# Patient Record
Sex: Male | Born: 1995 | Race: White | Hispanic: No | Marital: Single
Health system: Midwestern US, Academic
[De-identification: ages and names within clinical notes are randomized; demographics above are authoritative.]

---

## 2021-12-24 ENCOUNTER — Emergency Department: Admit: 2021-12-24 | Discharge: 2021-12-24

## 2021-12-24 ENCOUNTER — Encounter: Admit: 2021-12-24 | Discharge: 2021-12-24

## 2021-12-24 LAB — BASIC METABOLIC PANEL
ANION GAP: 9 % (ref 3–12)
CHLORIDE: 105 MMOL/L (ref 98–110)
SODIUM: 141 MMOL/L (ref 137–147)

## 2021-12-24 LAB — ALCOHOL LEVEL: ALCOHOL: <10 mg/dL (ref >60)

## 2021-12-24 LAB — CREATINE KINASE-CPK: CK TOTAL: 92 U/L (ref 35–232)

## 2021-12-24 LAB — IONIZED CALCIUM: IONIZED CALCIUM: 1.1 MMOL/L (ref 1.0–1.3)

## 2021-12-24 LAB — CBC
HEMOGLOBIN: 14 g/dL (ref 13.5–16.5)
MCHC: 34 g/dL (ref 32.0–36.0)
MCV: 88 FL — ABNORMAL LOW (ref 80–100)
PLATELET COUNT: 264 K/UL — ABNORMAL HIGH (ref 150–400)
RBC COUNT: 4.7 M/UL (ref 4.4–5.5)
WBC COUNT: 14 K/UL — ABNORMAL HIGH (ref 4.5–11.0)

## 2021-12-24 LAB — LACTIC ACID (BG - RAPID LACTATE): LACTIC ACID(SYRINGE): 1.4 MMOL/L (ref 0.5–2.0)

## 2021-12-24 LAB — BETA-HCG: BETA-HCG: <1 U/L (ref <5)

## 2021-12-24 MED ORDER — IOHEXOL 350 MG IODINE/ML IV SOLN
100 mL | Freq: Once | INTRAVENOUS | 0 refills | Status: CP
Start: 2021-12-24 — End: ?
  Administered 2021-12-24: 100 mL via INTRAVENOUS

## 2021-12-24 MED ORDER — FENTANYL CITRATE (PF) 50 MCG/ML IJ SOLN
50 ug | Freq: Once | INTRAVENOUS | 0 refills | Status: CP
Start: 2021-12-24 — End: ?
  Administered 2021-12-25: 01:00:00 50 ug via INTRAVENOUS

## 2021-12-24 MED ORDER — SODIUM CHLORIDE 0.9 % IJ SOLN
50 mL | Freq: Once | INTRAVENOUS | 0 refills | Status: CP
Start: 2021-12-24 — End: ?
  Administered 2021-12-24: 50 mL via INTRAVENOUS
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
## 2021-12-24 NOTE — Other
12/24/2021 Micheal Steele [phone]    Attending of Record: Fransico Setters, MD   Performing Provider: Veva Holes, MD    Procedure: Limited Abdominal Ultrasound (Trauma FAST Exam)    Areas viewed: right lateral window, left lateral window, sub-xyphoid/pericardial window, and pelvic window    Indication for Procedure: trauma    Findings: Negative for intra-abdominal free fluid or pericardial effusion    Plan from Findings: CT Head, C-spine, Chest, Abdomen/Pelvis
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
## 2021-12-25 ENCOUNTER — Encounter: Admit: 2021-12-25 | Discharge: 2021-12-25 | Payer: 59

## 2021-12-25 ENCOUNTER — Inpatient Hospital Stay: Admit: 2021-12-25 | Discharge: 2021-12-25 | Payer: 59

## 2021-12-25 MED ADMIN — TRAMADOL 50 MG PO TAB [14632]: 50 mg | ORAL | @ 06:00:00 | Stop: 2021-12-25 | NDC 68084080811

## 2021-12-25 MED ADMIN — ACETAMINOPHEN 325 MG PO TAB [101]: 650 mg | ORAL | @ 06:00:00 | NDC 00904677361

## 2021-12-25 MED ADMIN — HYDROMORPHONE (PF) 2 MG/ML IJ SYRG [163476]: 0.5 mg | INTRAVENOUS | @ 07:00:00 | Stop: 2021-12-25 | NDC 00409131203

## 2021-12-25 MED ADMIN — CYCLOBENZAPRINE 10 MG PO TAB [2017]: 10 mg | ORAL | @ 14:00:00 | NDC 72888001400

## 2021-12-25 MED ADMIN — ENOXAPARIN 30 MG/0.3 ML SC SYRG [85048]: 30 mg | SUBCUTANEOUS | @ 14:00:00 | NDC 00781323801

## 2021-12-25 MED ADMIN — OXYCODONE 5 MG PO TAB [10814]: 5 mg | ORAL | @ 17:00:00 | NDC 00904696661

## 2021-12-25 MED ADMIN — CYCLOBENZAPRINE 10 MG PO TAB [2017]: 10 mg | ORAL | @ 20:00:00 | NDC 72888001400

## 2021-12-25 MED ADMIN — ONDANSETRON HCL (PF) 4 MG/2 ML IJ SOLN [136012]: 4 mg | INTRAVENOUS | @ 14:00:00 | NDC 72266012301

## 2021-12-25 MED ADMIN — SODIUM CHLORIDE 0.9 % IJ SOLN [7319]: 50 mL | INTRAVENOUS | @ 19:00:00 | Stop: 2021-12-25 | NDC 00409488820

## 2021-12-25 MED ADMIN — IOHEXOL 350 MG IODINE/ML IV SOLN [81210]: 100 mL | INTRAVENOUS | @ 19:00:00 | Stop: 2021-12-25 | NDC 00407141491

## 2021-12-25 MED ADMIN — ACETAMINOPHEN 325 MG PO TAB [101]: 650 mg | ORAL | @ 20:00:00 | NDC 00904677361

## 2021-12-25 MED ADMIN — TRAMADOL 50 MG PO TAB [14632]: 50 mg | ORAL | @ 14:00:00 | Stop: 2021-12-25 | NDC 68084080811

## 2021-12-25 MED ADMIN — ONDANSETRON HCL (PF) 4 MG/2 ML IJ SOLN [136012]: 4 mg | INTRAVENOUS | @ 07:00:00 | NDC 00641607801

## 2021-12-25 MED ADMIN — OXYCODONE 5 MG PO TAB [10814]: 5 mg | ORAL | @ 23:00:00 | NDC 00904696661

## 2021-12-25 MED ADMIN — ACETAMINOPHEN 325 MG PO TAB [101]: 650 mg | ORAL | @ 14:00:00 | NDC 00904677361
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
## 2021-12-26 ENCOUNTER — Encounter: Admit: 2021-12-26 | Discharge: 2021-12-26 | Payer: 59

## 2021-12-26 MED ADMIN — ACETAMINOPHEN 325 MG PO TAB [101]: 650 mg | ORAL | @ 01:00:00 | NDC 00904677361

## 2021-12-26 MED ADMIN — ACETAMINOPHEN 325 MG PO TAB [101]: 650 mg | ORAL | @ 07:00:00 | Stop: 2021-12-26 | NDC 00904677361

## 2021-12-26 MED ADMIN — ENOXAPARIN 30 MG/0.3 ML SC SYRG [85048]: 30 mg | SUBCUTANEOUS | @ 13:00:00 | Stop: 2021-12-26 | NDC 00781323801

## 2021-12-26 MED ADMIN — SENNOSIDES 8.6 MG PO TAB [11349]: 1 | ORAL | @ 13:00:00 | Stop: 2021-12-26 | NDC 00904652261

## 2021-12-26 MED ADMIN — CYCLOBENZAPRINE 10 MG PO TAB [2017]: 10 mg | ORAL | @ 01:00:00 | NDC 72888001400

## 2021-12-26 MED ADMIN — POLYETHYLENE GLYCOL 3350 17 GRAM PO PWPK [25424]: 17 g | ORAL | @ 13:00:00 | Stop: 2021-12-26 | NDC 00904693186

## 2021-12-26 MED ADMIN — ACETAMINOPHEN 325 MG PO TAB [101]: 650 mg | ORAL | @ 13:00:00 | Stop: 2021-12-26 | NDC 00904677361

## 2021-12-26 MED ADMIN — SENNOSIDES 8.6 MG PO TAB [11349]: 1 | ORAL | @ 01:00:00 | NDC 00904652261

## 2021-12-26 MED ADMIN — CYCLOBENZAPRINE 10 MG PO TAB [2017]: 10 mg | ORAL | @ 13:00:00 | Stop: 2021-12-26 | NDC 72888001400

## 2021-12-26 MED ADMIN — ENOXAPARIN 30 MG/0.3 ML SC SYRG [85048]: 30 mg | SUBCUTANEOUS | @ 01:00:00 | NDC 00781323801

## 2021-12-26 MED ADMIN — OXYCODONE 5 MG PO TAB [10814]: 5 mg | ORAL | @ 11:00:00 | Stop: 2021-12-26 | NDC 00904696661

## 2021-12-26 MED ADMIN — OXYCODONE 5 MG PO TAB [10814]: 5 mg | ORAL | @ 17:00:00 | Stop: 2021-12-26 | NDC 00904696661

## 2021-12-26 MED FILL — OXYCODONE 5 MG PO TAB: 5 mg | ORAL | 2 days supply | Qty: 10 | Fill #1 | Status: CP

## 2021-12-26 MED FILL — CYCLOBENZAPRINE 10 MG PO TAB: 10 mg | ORAL | 10 days supply | Qty: 30 | Fill #1 | Status: CP

## 2021-12-30 ENCOUNTER — Encounter: Admit: 2021-12-30 | Discharge: 2021-12-30 | Payer: 59

## 2021-12-30 DIAGNOSIS — S63642A Sprain of metacarpophalangeal joint of left thumb, initial encounter: Secondary | ICD-10-CM

## 2021-12-31 ENCOUNTER — Encounter: Admit: 2021-12-31 | Discharge: 2021-12-31 | Payer: 59

## 2021-12-31 ENCOUNTER — Ambulatory Visit: Admit: 2021-12-31 | Discharge: 2021-12-31 | Payer: 59

## 2021-12-31 DIAGNOSIS — S62515A Nondisplaced fracture of proximal phalanx of left thumb, initial encounter for closed fracture: Secondary | ICD-10-CM

## 2021-12-31 DIAGNOSIS — S63642A Sprain of metacarpophalangeal joint of left thumb, initial encounter: Secondary | ICD-10-CM

## 2021-12-31 DIAGNOSIS — T1490XA Injury, unspecified, initial encounter: Secondary | ICD-10-CM

## 2021-12-31 DIAGNOSIS — S63649A Sprain of metacarpophalangeal joint of unspecified thumb, initial encounter: Secondary | ICD-10-CM

## 2021-12-31 NOTE — Telephone Encounter
Patient presented at clinic - was in building for ortho appointment for left hand - told them of pain in left groin; Was instructed to follow up with Trauma regarding left groin pain and his request for more pain medication.  States has swelling in left groin down to middle of thigh.  Cannot lie flat due to pain and not comfortable sitting. Able to walk with assist of straight cane.    Also has FMLA paperwork to be completed - instructed paperwork will be completed and submitted.  Follow up trauma appointment is already scheduled for 01/06/22- patient plans to attend.    Patient's left groin pain and request for pain medication will be forwarded to Trauma APP.  Patient agrees with plan.
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
## 2022-01-06 ENCOUNTER — Encounter: Admit: 2022-01-06 | Discharge: 2022-01-06 | Payer: 59

## 2022-01-06 ENCOUNTER — Ambulatory Visit: Admit: 2022-01-06 | Discharge: 2022-01-07 | Payer: 59

## 2022-01-06 DIAGNOSIS — Z09 Encounter for follow-up examination after completed treatment for conditions other than malignant neoplasm: Secondary | ICD-10-CM

## 2022-01-06 MED ORDER — NALOXONE 4 MG/ACTUATION NA SPRY
NASAL | 0 refills | 1.00000 days | Status: AC
Start: 2022-01-06 — End: ?
  Filled 2022-01-06: qty 2, 1d supply, fill #1

## 2022-01-06 MED ORDER — TRAMADOL 50 MG PO TAB
50 mg | ORAL_TABLET | ORAL | 0 refills | Status: AC | PRN
Start: 2022-01-06 — End: ?
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
## 2022-01-07 ENCOUNTER — Encounter: Admit: 2022-01-07 | Discharge: 2022-01-07 | Payer: 59

## 2022-01-07 DIAGNOSIS — T1490XA Injury, unspecified, initial encounter: Secondary | ICD-10-CM

## 2022-01-08 ENCOUNTER — Encounter: Admit: 2022-01-08 | Discharge: 2022-01-08 | Payer: 59

## 2022-01-09 ENCOUNTER — Encounter: Admit: 2022-01-09 | Discharge: 2022-01-09 | Payer: 59

## 2022-01-09 NOTE — Telephone Encounter
Patient had left VM requesting prescription for Tramadol that was sent to [facility] pharmacy be sent to [facility] in [place].  Patient can't get to [facility] to pick up.     Talked with trauma provider on call [facility] APP - she is forwarding the prescription to [facility].      01/09/22  15:20 - call to patient to inform him that the prescription is being sent to [facility].

## 2022-01-13 ENCOUNTER — Encounter: Admit: 2022-01-13 | Discharge: 2022-01-13 | Payer: 59

## 2022-01-13 NOTE — Telephone Encounter
Called the patient to let him know that the [facility] paperwork that he requested is completed and signed. The form states specifically to not fax it back to the [employer] but to return this to the patient. Pt notified that forms are ready for pickup and will pick these up from the front desk when he is here for an ortho appointment on 5/31.     Forms placed in an envelope, labeled and placed at the front desk. Copy of forms were made and placed in scan bin.

## 2022-01-16 ENCOUNTER — Encounter: Admit: 2022-01-16 | Discharge: 2022-01-16 | Payer: 59

## 2022-01-16 DIAGNOSIS — T1490XA Injury, unspecified, initial encounter: Secondary | ICD-10-CM

## 2022-01-20 ENCOUNTER — Encounter: Admit: 2022-01-20 | Discharge: 2022-01-20 | Payer: 59

## 2022-01-20 NOTE — Telephone Encounter
Prior Authorization approval received from [employer] for the pt's tramadol 50 mg. Approval dates are 01/12/22-07/11/22 as long as there are no changes to his plan benefits. Approval letter placed to scan bin to be placed in the chart.

## 2022-01-21 ENCOUNTER — Encounter: Admit: 2022-01-21 | Discharge: 2022-01-21 | Payer: 59

## 2022-01-21 ENCOUNTER — Ambulatory Visit: Admit: 2022-01-21 | Discharge: 2022-01-21 | Payer: 59

## 2022-01-21 DIAGNOSIS — S63649A Sprain of metacarpophalangeal joint of unspecified thumb, initial encounter: Secondary | ICD-10-CM

## 2022-01-21 DIAGNOSIS — T1490XA Injury, unspecified, initial encounter: Secondary | ICD-10-CM

## 2022-01-21 DIAGNOSIS — S62515D Nondisplaced fracture of proximal phalanx of left thumb, subsequent encounter for fracture with routine healing: Secondary | ICD-10-CM

## 2022-01-21 DIAGNOSIS — S63642D Sprain of metacarpophalangeal joint of left thumb, subsequent encounter: Secondary | ICD-10-CM

## 2022-01-21 DIAGNOSIS — S62515A Nondisplaced fracture of proximal phalanx of left thumb, initial encounter for closed fracture: Secondary | ICD-10-CM

## 2022-01-21 NOTE — Progress Notes
Chief Complaint   Patient presents with   ? Follow Up     L thumb fx/DOI 5.3.23         Interval Hx:   The patient is a 27 year old male presenting for evaluation of his left thumb fracture on 12/24/2021. He is now approximately 4 weeks out. He reports he has minimal to no pain. He is just seen by therapy.    The following portions of the patient's history were reviewed and updated as appropriate.  Past Medical History:   No past medical history on file.      Past Surgical History:   No past surgical history on file.    Family History:  No family history on file.  Social History:   Social History     Socioeconomic History   ? Marital status: Single   Tobacco Use   ? Smoking status: Every Day     Years: 8.00     Types: Cigarettes, Cigars   ? Smokeless tobacco: Never   Substance and Sexual Activity   ? Alcohol use: Yes   ? Drug use: Never       Current Medications: has a current medication list which includes the following prescription(s): acetaminophen, cyclobenzaprine, naloxone, oxycodone, polyethylene glycol 3350, and tramadol.    Allergies: has No Known Allergies.    Review of Systems:  Review of Systems   Musculoskeletal: Positive for arthralgias (L thumb).   All other systems reviewed and are negative.    General Physical Exam:  General/Constitutional:No apparent distress: well-nourished and well developed.  Eyes: Sclera nonicteric, conjunctiva clear  Respiratory:No shortness of breath or dyspnea  Cardiac: No clubbing, cyanosis, or edema  Vascular: No edema, swelling or tenderness, except as noted in detailed exam.  Integumentary:No impressive skin lesions present, except as noted in detailed exam.  Neuro/Psych: Normal mood and affect, oriented to person, place and time.  Musculoskeletal: Normal, except as noted in detailed exam and in HPI.      Focused Physical Examination:  Wt 88.9 kg (196 lb)  - BMI 31.64 kg/m?   Left Hand Exam.  Examination of the left thumb shows he has no evidence of instability at the MCP joint. This is to varus and valgus stress. It is equal to the contralateral side. The remainder of the hand examination and thumb examination is benign.    Imaging:  X-rays taken of the left thumb were reviewed showing stable appearance of a small nondisplaced ulnar collateral ligament avulsion injury.     Assessment and Plan:  A 26 year old male presenting for evaluation of a left thumb avulsion type injury to the ulnar collateral ligament.    He has done quite well. I advised him to stay away from heavy lifting, pushing, or pulling out the brace until the 7 or 8 week mark. I will see him back with any questions or concerns.        Letitia Neri, MD  Assistant Professor,  Department of Orthopaedic Surgery  [employer]      ATTESTATION  This visit was documented by [facility] via audio recorded by Tretha Sciara, MD, on Jan 21, 2022 at 12:42 PM

## 2022-01-21 NOTE — Progress Notes
Hand Therapy Daily Note:  Name:Micheal Steele  DOB:February 23, 1996  ZOX#:[id number]  Referring Physician:Brubacher, Edison Nasuti  Insurance:[employer]  Injury/Onset Date:          12/24/21  Surgical Date:      Medical Diagnosis: Left thumb fracture, gamekeepers  Treatment Diagnosis:   Date of Initial Evaluation: 12/31/21  Follow-up Physician Appointment:01/21/22  Visit # 2    Subjective: "I can tell my thumb is getting better. It doesn't hurt as much to move it."    Pain: Left:  Location: Thumb    Pain Rating:   Current: 0/10     Objective:    Evaluation:   AROM Left:  THUMB    Thumb MCP 7/46   Thumb IP 0/41   TAM    CMC Radial Abduction    CMC Palmar Abduction 50   CMC Adduction                     cm   CMC Opposition                     cm   AROM Left wrist and fingers are WNL  Hand Exam     Treatment:     Left:  Thumb  And fingers      Treatment Provided:    Therapeutic Exercise: AROM - thumb    Strengthening - Therapy Putty - Medium resistance theraputty issued to begin grip strengthening with index through small fingers only in 2 weeks.    Education:Handout Issued: Active Thumb Exercises    Orthosis: Pt. To wear MCP thumb spica splint except exercise and hygiene for 2 weeks.    Assessment:    STG Goals:    Goal Number:  2  Goal: Patient to verbalize/demonstrate exercises correctly.  Goal Status:  Met      Patient's tolerance of treatment: Good    Plan:    Frequency of treatment:  To follow home program

## 2022-03-25 ENCOUNTER — Encounter: Admit: 2022-03-25 | Discharge: 2022-03-25 | Payer: 59

## 2022-10-14 ENCOUNTER — Encounter: Admit: 2022-10-14 | Discharge: 2022-10-14
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
